# Patient Record
Sex: Female | Born: 1980 | Race: White | Hispanic: No | Marital: Single | Smoking: Never smoker
Health system: Southern US, Community
[De-identification: ages and names within clinical notes are randomized; demographics above are authoritative.]

---

## 2010-08-12 ENCOUNTER — Emergency Department (HOSPITAL_COMMUNITY): Admission: EM | Admit: 2010-08-12 | Discharge: 2010-08-12 | Payer: Self-pay | Admitting: Emergency Medicine

## 2019-01-25 DIAGNOSIS — I480 Paroxysmal atrial fibrillation: Secondary | ICD-10-CM | POA: Insufficient documentation

## 2019-02-17 DIAGNOSIS — E782 Mixed hyperlipidemia: Secondary | ICD-10-CM | POA: Insufficient documentation

## 2019-02-17 DIAGNOSIS — F419 Anxiety disorder, unspecified: Secondary | ICD-10-CM | POA: Insufficient documentation

## 2019-02-17 DIAGNOSIS — G5712 Meralgia paresthetica, left lower limb: Secondary | ICD-10-CM | POA: Insufficient documentation

## 2019-03-09 DIAGNOSIS — R42 Dizziness and giddiness: Secondary | ICD-10-CM | POA: Insufficient documentation

## 2019-05-18 DIAGNOSIS — Z9889 Other specified postprocedural states: Secondary | ICD-10-CM | POA: Insufficient documentation

## 2019-11-11 DIAGNOSIS — R001 Bradycardia, unspecified: Secondary | ICD-10-CM | POA: Insufficient documentation

## 2020-07-30 DIAGNOSIS — Z950 Presence of cardiac pacemaker: Secondary | ICD-10-CM | POA: Insufficient documentation

## 2020-12-05 DIAGNOSIS — Z45018 Encounter for adjustment and management of other part of cardiac pacemaker: Secondary | ICD-10-CM | POA: Insufficient documentation

## 2020-12-05 DIAGNOSIS — I495 Sick sinus syndrome: Secondary | ICD-10-CM | POA: Insufficient documentation

## 2021-12-09 ENCOUNTER — Ambulatory Visit
Admission: EM | Admit: 2021-12-09 | Discharge: 2021-12-09 | Disposition: A | Discharge status: Home / Self Care | Payer: Medicaid Other

## 2021-12-09 ENCOUNTER — Other Ambulatory Visit: Payer: Self-pay

## 2021-12-09 DIAGNOSIS — L0291 Cutaneous abscess, unspecified: Secondary | ICD-10-CM

## 2021-12-09 MED ORDER — DOXYCYCLINE HYCLATE 100 MG PO CAPS: 100.0000 mg | ORAL_CAPSULE | Freq: Two times a day (BID) | ORAL | 0 refills | Status: DC

## 2021-12-09 NOTE — ED Provider Notes (Addendum)
?[facility] ? ? ? ?CSN: [id number] ?Arrival date & time: 12/09/21  4268 ? ? ?  ? ?History   ?Chief Complaint ?Chief Complaint  ?Patient presents with  ? Abscess  ?  back  ? ? ?HPI ?Laura Weaver is a 41 y.o. female.  ? ?Patient here today for evaluation of possible abscess to her back. She reports lesion has been present for about a week. She has had some changes with central lesion changing from white to "black" in color. She has not had fever. She has tried taking warm baths and using warm compresses but difficulty to reach area due to location. ? ? ?Abscess ?Associated symptoms: no fever, no nausea and no vomiting   ? ?History reviewed. No pertinent past medical history. ? ?There are no problems to display for this patient. ? ? ?History reviewed. No pertinent surgical history. ? ?OB History   ?No obstetric history on file. ?  ? ? ? ?Home Medications   ? ?Prior to Admission medications   ?Medication Sig Start Date End Date Taking? Authorizing Provider  ?atorvastatin (LIPITOR) 40 MG tablet Take by mouth. 10/15/21 01/13/22 Yes [provider]  ?doxycycline (VIBRAMYCIN) 100 MG capsule Take 1 capsule (100 mg total) by mouth 2 (two) times daily. 12/09/21  Yes Tomi Bamberger, PA-C  ?ALPRAZolam (XANAX) 0.5 MG tablet Take 0.5 mg by mouth at bedtime as needed. 11/28/21   [provider]  ?ELIQUIS 5 MG TABS tablet Take 5 mg by mouth 2 (two) times daily. 12/01/21   [provider]  ?flecainide (TAMBOCOR) 50 MG tablet SMARTSIG:1.5 Tablet(s) By Mouth Every 12 Hours 11/28/21   [provider]  ?metoprolol tartrate (LOPRESSOR) 25 MG tablet Take 25 mg by mouth 2 (two) times daily. 10/17/21   [provider]  ? ? ?Family History ?History reviewed. No pertinent family history. ? ?Social History ?Social History  ? ?Tobacco Use  ? Smoking status: Never  ? Smokeless tobacco: Never  ? ? ? ?Allergies   ?Codeine and Penicillin g ? ? ?Review of Systems ?Review of Systems  ?Constitutional:   Negative for chills and fever.  ?Eyes:  Negative for discharge and redness.  ?Gastrointestinal:  Negative for abdominal pain, nausea and vomiting.  ?Skin:  Positive for color change and wound.  ? ? ?Physical Exam ?Triage Vital Signs ?ED Triage Vitals  ?Enc Vitals Group  ?   BP 12/09/21 0929 104/76  ?   Pulse Rate 12/09/21 0929 81  ?   Resp 12/09/21 0929 18  ?   Temp 12/09/21 0929 98.2 ?F (36.8 ?C)  ?   Temp Source 12/09/21 0929 Oral  ?   SpO2 12/09/21 0929 98 %  ?   Weight --   ?   Height --   ?   Head Circumference --   ?   Peak Flow --   ?   Pain Score 12/09/21 0927 10  ?   Pain Loc --   ?   Pain Edu? --   ?   Excl. in GC? --   ? ?No data found. ? ?Updated Vital Signs ?BP 104/76 (BP Location: Left Arm)   Pulse 81   Temp 98.2 ?F (36.8 ?C) (Oral)   Resp 18   LMP 12/09/2021 (Approximate)   SpO2 98%  ?   ? ?Physical Exam ?Vitals and nursing note reviewed.  ?Constitutional:   ?   General: She is not in acute distress. ?   Appearance: Normal appearance. She  is not ill-appearing.  ?HENT:  ?   Head: Normocephalic and atraumatic.  ?Eyes:  ?   Conjunctiva/sclera: Conjunctivae normal.  ?Cardiovascular:  ?   Rate and Rhythm: Normal rate.  ?Pulmonary:  ?   Effort: Pulmonary effort is normal.  ?Skin: ?   Comments: Approx 3-4 cm area of induration and erythema to right thoracic back below braline- no active drainage or bleeding, scabbed wounds to center-- no fluctuance noted  ?Neurological:  ?   Mental Status: She is alert.  ?Psychiatric:     ?   Mood and Affect: Mood normal.     ?   Behavior: Behavior normal.     ?   Thought Content: Thought content normal.  ? ? ? ?UC Treatments / Results  ?Labs ?(all labs ordered are listed, but only abnormal results are displayed) ?Labs Reviewed - No data to display ? ?EKG ? ? ?Radiology ?No results found. ? ?Procedures ?Procedures (including critical care time) ? ?Medications Ordered in UC ?Medications - No data to display ? ?Initial Impression / Assessment and Plan / UC Course  ?I  have reviewed the triage vital signs and the nursing notes. ? ?Pertinent labs & imaging results that were available during my care of the patient were reviewed by me and considered in my medical decision making (see chart for details). ? ? Given lack of fluctuance and multiple wounds less likely that improvement would occur with I and D. Will treat with doxycycline and recommend follow up if no improvement or if symptoms worsen.  ? ?Final Clinical Impressions(s) / UC Diagnoses  ? ?Final diagnoses:  ?Abscess  ? ?Discharge Instructions   ?None ?  ? ?ED Prescriptions   ? ? Medication Sig Dispense Auth. Provider  ? doxycycline (VIBRAMYCIN) 100 MG capsule Take 1 capsule (100 mg total) by mouth 2 (two) times daily. 20 capsule Tomi Bamberger, PA-C  ? ?  ? ?PDMP not reviewed this encounter. ?  ?Tomi Bamberger, PA-C ?12/09/21 1247 ? ?  ?Tomi Bamberger, PA-C ?12/09/21 1247 ? ?

## 2021-12-09 NOTE — ED Triage Notes (Signed)
One week h/o abscess on back near her bra line. Has tried to apply neosporin, but notes that she cannot reach the area well. Has been taking hot baths and using warm compresses. Also taking tylenol w/o relief. Lesion has went from a white color to a black shade. Pt needs a work note. ?

## 2022-04-03 ENCOUNTER — Ambulatory Visit
Admission: EM | Admit: 2022-04-03 | Discharge: 2022-04-03 | Disposition: A | Discharge status: Home / Self Care | Payer: Medicaid Other | Attending: Family Medicine | Admitting: Family Medicine

## 2022-04-03 DIAGNOSIS — H7293 Unspecified perforation of tympanic membrane, bilateral: Secondary | ICD-10-CM

## 2022-04-03 DIAGNOSIS — H6693 Otitis media, unspecified, bilateral: Secondary | ICD-10-CM | POA: Diagnosis not present

## 2022-04-03 DIAGNOSIS — J069 Acute upper respiratory infection, unspecified: Secondary | ICD-10-CM | POA: Diagnosis not present

## 2022-04-03 MED ORDER — CEFDINIR 300 MG PO CAPS: 600.0000 mg | ORAL_CAPSULE | Freq: Every day | ORAL | 0 refills | Status: AC

## 2022-04-03 MED ORDER — BENZONATATE 100 MG PO CAPS: 100.0000 mg | ORAL_CAPSULE | Freq: Three times a day (TID) | ORAL | 0 refills | Status: AC | PRN

## 2022-04-03 NOTE — ED Provider Notes (Signed)
[facility]    CSN: [id number] Arrival date & time: 04/03/22  1207      History   Chief Complaint Chief Complaint  Patient presents with   Cough   Otalgia    HPI Laura Weaver is a 41 y.o. female.    Cough Associated symptoms: ear pain   Otalgia Associated symptoms: cough    Here for cough and nasal congestion that began on July 3.  No nausea or vomiting.  No diarrhea.  No fever.  Then her ears bilaterally have started draining and hurting in the last 24 to 48 hours.  She does have a remote history of tympanic membrane perforation  Past medical history significant for atrial fibrillation, and she is on Eliquis and flecainide  She is allergic to penicillin which causes rash and hives.  It did not cause anaphylaxis  History reviewed. No pertinent past medical history.  There are no problems to display for this patient.   History reviewed. No pertinent surgical history.  OB History   No obstetric history on file.      Home Medications    Prior to Admission medications   Medication Sig Start Date End Date Taking? Authorizing Provider  benzonatate (TESSALON) 100 MG capsule Take 1 capsule (100 mg total) by mouth 3 (three) times daily as needed for cough. 04/03/22  Yes Zenia Resides, MD  cefdinir (OMNICEF) 300 MG capsule Take 2 capsules (600 mg total) by mouth daily for 7 days. 04/03/22 04/10/22 Yes Korena Nass, Janace Aris, MD  ALPRAZolam Prudy Feeler) 0.5 MG tablet Take 0.5 mg by mouth at bedtime as needed. 11/28/21   [provider]  atorvastatin (LIPITOR) 40 MG tablet Take by mouth. 10/15/21 01/13/22  [provider]  ELIQUIS 5 MG TABS tablet Take 5 mg by mouth 2 (two) times daily. 12/01/21   [provider]  flecainide (TAMBOCOR) 50 MG tablet SMARTSIG:1.5 Tablet(s) By Mouth Every 12 Hours 11/28/21   [provider]  metoprolol tartrate (LOPRESSOR) 25 MG tablet Take 25 mg by mouth 2 (two) times daily. 10/17/21   [provider]    Family History History reviewed. No pertinent family history.  Social History Social History   Tobacco Use   Smoking status: Never   Smokeless tobacco: Never     Allergies   Codeine and Penicillin g   Review of Systems Review of Systems  HENT:  Positive for ear pain.   Respiratory:  Positive for cough.      Physical Exam Triage Vital Signs ED Triage Vitals  Enc Vitals Group     BP 04/03/22 1319 105/71     Pulse Rate 04/03/22 1319 80     Resp 04/03/22 1319 18     Temp 04/03/22 1319 98.9 F (37.2 C)     Temp Source 04/03/22 1319 Oral     SpO2 04/03/22 1319 98 %     Weight --      Height --      Head Circumference --      Peak Flow --      Pain Score 04/03/22 1318 8     Pain Loc --      Pain Edu? --      Excl. in GC? --    No data found.  Updated Vital Signs BP 105/71 (BP Location: Left Arm)   Pulse 80   Temp 98.9 F (37.2 C) (Oral)   Resp 18   SpO2 98%   Visual Acuity Right  Eye Distance:   Left Eye Distance:   Bilateral Distance:    Right Eye Near:   Left Eye Near:    Bilateral Near:     Physical Exam Vitals reviewed.  Constitutional:      General: She is not in acute distress.    Appearance: She is not toxic-appearing.  HENT:     Ears:     Comments: Her left tympanic membrane is gray and dull.  There is some thin white discharge in the canal.  Her right tympanic membrane is mildly erythematous and dull and there is thin white discharge in that canal.  I cannot clearly see a perforation in either tympanic membrane    Nose: Nose normal.     Mouth/Throat:     Mouth: Mucous membranes are moist.     Pharynx: No oropharyngeal exudate or posterior oropharyngeal erythema.  Eyes:     Extraocular Movements: Extraocular movements intact.     Conjunctiva/sclera: Conjunctivae normal.     Pupils: Pupils are equal, round, and reactive to light.  Cardiovascular:     Rate and Rhythm: Normal rate and regular rhythm.     Heart sounds:  No murmur heard. Pulmonary:     Effort: Pulmonary effort is normal. No respiratory distress.     Breath sounds: No wheezing, rhonchi or rales.  Chest:     Chest wall: No tenderness.  Musculoskeletal:     Cervical back: Neck supple.  Lymphadenopathy:     Cervical: No cervical adenopathy.  Skin:    Capillary Refill: Capillary refill takes less than 2 seconds.     Coloration: Skin is not jaundiced or pale.  Neurological:     General: No focal deficit present.     Mental Status: She is alert and oriented to person, place, and time.  Psychiatric:        Behavior: Behavior normal.      UC Treatments / Results  Labs (all labs ordered are listed, but only abnormal results are displayed) Labs Reviewed - No data to display  EKG   Radiology No results found.  Procedures Procedures (including critical care time)  Medications Ordered in UC Medications - No data to display  Initial Impression / Assessment and Plan / UC Course  I have reviewed the triage vital signs and the nursing notes.  Pertinent labs & imaging results that were available during my care of the patient were reviewed by me and considered in my medical decision making (see chart for details).     Though I cannot see the perforation, I feel the exam is consistent with otitis media with perforation since she has discharge in the mL.  I am going to treat with antibiotics--a cephalosporin.  I think she also has a viral upper respiratory infection, and I discussed her getting a COVID test today, since she is at risk for severe COVID disease with her health problems.  She declines this testing Final Clinical Impressions(s) / UC Diagnoses   Final diagnoses:  Acute otitis media with perforated tympanic membrane, bilateral  Viral URI with cough     Discharge Instructions      Take cefdinir 300 mg--2 capsules together once daily for 7 days  Take benzonatate 100 mg, 1 tab every 8 hours as needed for  cough.      ED Prescriptions     Medication Sig Dispense Auth. Provider   cefdinir (OMNICEF) 300 MG capsule Take 2 capsules (600 mg total) by mouth daily for 7 days.  14 capsule Gryffin Altice, Janace Aris, MD   benzonatate (TESSALON) 100 MG capsule Take 1 capsule (100 mg total) by mouth 3 (three) times daily as needed for cough. 21 capsule Zenia Resides, MD      PDMP not reviewed this encounter.   Zenia Resides, MD 04/03/22 1345

## 2022-04-03 NOTE — ED Triage Notes (Signed)
Pt present coughing with nasal congestion and drainage with bilateral ear drainage. Symptom started on Monday.

## 2022-04-03 NOTE — Discharge Instructions (Signed)
Take cefdinir 300 mg--2 capsules together once daily for 7 days  Take benzonatate 100 mg, 1 tab every 8 hours as needed for cough.

## 2023-04-19 ENCOUNTER — Ambulatory Visit
Admission: EM | Admit: 2023-04-19 | Discharge: 2023-04-19 | Disposition: A | Discharge status: Home / Self Care | Payer: Medicaid Other | Attending: Physician Assistant | Admitting: Physician Assistant

## 2023-04-19 DIAGNOSIS — N760 Acute vaginitis: Secondary | ICD-10-CM

## 2023-04-19 NOTE — ED Triage Notes (Addendum)
For "extra information": "I have been passing kidney stones for a few weeks".   "Last Thursday had intercourse with my man and now I am having pain and redness with some itching in vaginal area". No discharge. No dysuria. No fever.

## 2023-04-19 NOTE — Discharge Instructions (Signed)
Check MyChart for results. Follow up if no gradual improvement or with any further concerns.

## 2023-04-19 NOTE — ED Provider Notes (Signed)
[facility]    CSN: [id number] Arrival date & time: 04/19/23  1524      History   Chief Complaint Chief Complaint  Patient presents with   Vaginal Problem   Flank Pain    HPI Laura Weaver is a 42 y.o. female.   Patient here today for evaluation of vaginal irritation after sexual intercourse last night.  She reports that she has had some itching but no discharge.  She denies any dysuria or urinary frequency.  She states she has had some flank pain but has been passing kidney stones which she is known about.  She denies any fever.  She has not any vomiting or abdominal pain.  She does not report any genital lesions or rashes.  The history is provided by the patient.  Flank Pain Pertinent negatives include no abdominal pain.    History reviewed. No pertinent past medical history.  Patient Active Problem List   Diagnosis Date Noted   Morbid obesity with BMI of 40.0-44.9, adult (HCC) 10/07/2022   Pacemaker reprogramming/check 12/05/2020   SSS (sick sinus syndrome) (HCC) 12/05/2020   Pacemaker 07/30/2020   Slow heartbeat 11/11/2019   S/P ablation of atrial flutter 05/18/2019   Dizziness 03/09/2019   Anxiety 02/17/2019   Meralgia paraesthetica, left 02/17/2019   Mixed hyperlipidemia 02/17/2019   Morbid obesity (HCC) 02/17/2019   PAF (paroxysmal atrial fibrillation) (HCC) 01/25/2019    History reviewed. No pertinent surgical history.  OB History   No obstetric history on file.      Home Medications    Prior to Admission medications   Medication Sig Start Date End Date Taking? Authorizing Provider  acetaminophen (TYLENOL) 500 MG tablet Take by mouth. 03/11/22  Yes [provider]  ALPRAZolam Prudy Feeler) 0.5 MG tablet Take 0.5 mg by mouth at bedtime as needed. 11/28/21  Yes [provider]  atorvastatin (LIPITOR) 40 MG tablet Take by mouth. 10/01/22  Yes [provider]  ciprofloxacin (CIPRO) 500 MG tablet Take 500 mg by mouth 2  (two) times daily. 03/20/23  Yes [provider]  Docusate Sodium (DSS) 100 MG CAPS Take by mouth. 09/18/21  Yes [provider]  ELIQUIS 5 MG TABS tablet Take 5 mg by mouth 2 (two) times daily. 12/01/21  Yes [provider]  flecainide (TAMBOCOR) 100 MG tablet Take by mouth. 09/02/22  Yes [provider]  HYDROcodone-acetaminophen (NORCO/VICODIN) 5-325 MG tablet Take by mouth. 03/20/23  Yes [provider]  hydrocortisone (ANUSOL-HC) 25 MG suppository Place rectally. 09/18/21  Yes [provider]  metoCLOPramide (REGLAN) 10 MG tablet Take by mouth. 08/15/22  Yes [provider]  metoprolol tartrate (LOPRESSOR) 25 MG tablet Take 25 mg by mouth 2 (two) times daily. 10/17/21  Yes [provider]  nitrofurantoin, macrocrystal-monohydrate, (MACROBID) 100 MG capsule Take 100 mg by mouth 2 (two) times daily. 04/08/23  Yes [provider]  ondansetron (ZOFRAN-ODT) 4 MG disintegrating tablet Take by mouth. 08/13/22  Yes [provider]  pantoprazole (PROTONIX) 20 MG tablet Take 1 tablet by mouth daily. 08/16/22  Yes [provider]  pantoprazole (PROTONIX) 40 MG tablet Take 1 tablet by mouth daily. 03/11/22  Yes [provider]  sulfamethoxazole-trimethoprim (BACTRIM DS) 800-160 MG tablet Take 1 tablet by mouth 2 (two) times daily. 03/22/23  Yes [provider]  tamsulosin (FLOMAX) 0.4 MG CAPS capsule Take 1 capsule by mouth daily. 03/20/23  Yes [provider]  atorvastatin (LIPITOR) 40 MG tablet Take by mouth.  10/15/21 01/13/22  [provider]  benzonatate (TESSALON) 100 MG capsule Take 1 capsule (100 mg total) by mouth 3 (three) times daily as needed for cough. 04/03/22   Zenia Resides, MD  flecainide (TAMBOCOR) 50 MG tablet SMARTSIG:1.5 Tablet(s) By Mouth Every 12 Hours 11/28/21   [provider]    Family History History reviewed. No pertinent family  history.  Social History Social History   Tobacco Use   Smoking status: Never   Smokeless tobacco: Never  Vaping Use   Vaping status: Never Used  Substance Use Topics   Alcohol use: Not Currently   Drug use: Yes    Types: Marijuana    Comment: Daily     Allergies   Codeine, Penicillin g, and Ciprofloxacin   Review of Systems Review of Systems  Constitutional:  Negative for chills and fever.  Eyes:  Negative for discharge and redness.  Gastrointestinal:  Negative for abdominal pain, nausea and vomiting.  Genitourinary:  Negative for dysuria and vaginal discharge.     Physical Exam Triage Vital Signs ED Triage Vitals  Encounter Vitals Group     BP 04/19/23 1532 127/87     Systolic BP Percentile --      Diastolic BP Percentile --      Pulse Rate 04/19/23 1532 74     Resp 04/19/23 1532 18     Temp 04/19/23 1532 98.1 F (36.7 C)     Temp Source 04/19/23 1532 Oral     SpO2 04/19/23 1532 98 %     Weight 04/19/23 1529 228 lb (103.4 kg)     Height 04/19/23 1529 5\' 4"  (1.626 m)     Head Circumference --      Peak Flow --      Pain Score 04/19/23 1529 1     Pain Loc --      Pain Education --      Exclude from Growth Chart --    No data found.  Updated Vital Signs BP 127/87 (BP Location: Left Arm)   Pulse 74   Temp 98.1 F (36.7 C) (Oral)   Resp 18   Ht 5\' 4"  (1.626 m)   Wt 228 lb (103.4 kg)   LMP 04/08/2023 (Exact Date)   SpO2 98%   BMI 39.14 kg/m      Physical Exam Vitals and nursing note reviewed.  Constitutional:      General: She is not in acute distress.    Appearance: Normal appearance. She is not ill-appearing.  HENT:     Head: Normocephalic and atraumatic.  Eyes:     Conjunctiva/sclera: Conjunctivae normal.  Cardiovascular:     Rate and Rhythm: Normal rate.  Pulmonary:     Effort: Pulmonary effort is normal.  Neurological:     Mental Status: She is alert.  Psychiatric:        Mood and Affect: Mood normal.        Behavior: Behavior  normal.        Thought Content: Thought content normal.      UC Treatments / Results  Labs (all labs ordered are listed, but only abnormal results are displayed) Labs Reviewed  CERVICOVAGINAL ANCILLARY ONLY    EKG   Radiology No results found.  Procedures Procedures (including critical care time)  Medications Ordered in UC Medications - No data to display  Initial Impression / Assessment and Plan / UC Course  I have reviewed the triage vital signs and the nursing notes.  Pertinent  labs & imaging results that were available during my care of the patient were reviewed by me and considered in my medical decision making (see chart for details).    Discussed etiologies of vaginitis and will order cervicovaginal swab for further evaluation.  Will await results further recommendation.  Encouraged follow-up if no gradual improvement or with any further concerns.  Advised to avoid sexual intercourse until lab results are available for review.  Recommended follow-up with any worsening in the meantime or symptoms fail to improve.  Final Clinical Impressions(s) / UC Diagnoses   Final diagnoses:  Acute vaginitis     Discharge Instructions      Check MyChart for results. Follow up if no gradual improvement or with any further concerns.      ED Prescriptions   None    PDMP not reviewed this encounter.   Tomi Bamberger, PA-C 04/19/23 [phone]

## 2023-04-20 LAB — CERVICOVAGINAL ANCILLARY ONLY
Bacterial Vaginitis (gardnerella): NEGATIVE
Candida Glabrata: NEGATIVE
Candida Vaginitis: POSITIVE — AB
Chlamydia: NEGATIVE
Comment: NEGATIVE
Comment: NEGATIVE
Comment: NEGATIVE
Comment: NEGATIVE
Comment: NEGATIVE
Comment: NORMAL
Neisseria Gonorrhea: NEGATIVE
Trichomonas: NEGATIVE

## 2023-04-21 ENCOUNTER — Telehealth (HOSPITAL_COMMUNITY): Payer: Self-pay | Admitting: Emergency Medicine

## 2023-04-21 MED ORDER — FLUCONAZOLE 150 MG PO TABS: 150.0000 mg | ORAL_TABLET | Freq: Once | ORAL | 0 refills | Status: AC
